# Patient Record
Sex: Female | Born: 1990 | Race: White | Hispanic: No | Marital: Married | Smoking: Never smoker
Health system: Southern US, Community
[De-identification: ages and names within clinical notes are randomized; demographics above are authoritative.]

## PROBLEM LIST (undated history)

## (undated) DIAGNOSIS — R112 Nausea with vomiting, unspecified: Secondary | ICD-10-CM

## (undated) DIAGNOSIS — E232 Diabetes insipidus: Secondary | ICD-10-CM

## (undated) DIAGNOSIS — Z9889 Other specified postprocedural states: Secondary | ICD-10-CM

## (undated) DIAGNOSIS — E220 Acromegaly and pituitary gigantism: Secondary | ICD-10-CM

## (undated) DIAGNOSIS — E559 Vitamin D deficiency, unspecified: Secondary | ICD-10-CM

## (undated) DIAGNOSIS — D352 Benign neoplasm of pituitary gland: Secondary | ICD-10-CM

## (undated) DIAGNOSIS — E038 Other specified hypothyroidism: Secondary | ICD-10-CM

## (undated) DIAGNOSIS — N912 Amenorrhea, unspecified: Secondary | ICD-10-CM

## (undated) HISTORY — DX: Nausea with vomiting, unspecified: R11.2

## (undated) HISTORY — DX: Acromegaly and pituitary gigantism: E22.0

## (undated) HISTORY — DX: Other specified postprocedural states: Z98.890

## (undated) HISTORY — PX: OVARIAN CYST REMOVAL: SHX89

## (undated) HISTORY — DX: Amenorrhea, unspecified: N91.2

## (undated) HISTORY — DX: Benign neoplasm of pituitary gland: D35.2

## (undated) HISTORY — PX: TUBAL LIGATION: SHX77

## (undated) HISTORY — DX: Vitamin D deficiency, unspecified: E55.9

## (undated) HISTORY — PX: TRANSPHENOIDAL PITUITARY RESECTION: SHX2572

---
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
## 2009-07-25 ENCOUNTER — Emergency Department: Payer: Self-pay | Admitting: Emergency Medicine

## 2010-09-27 ENCOUNTER — Ambulatory Visit: Payer: Self-pay | Admitting: Obstetrics and Gynecology

## 2010-10-21 ENCOUNTER — Ambulatory Visit: Payer: Self-pay | Admitting: Obstetrics and Gynecology

## 2010-11-09 ENCOUNTER — Ambulatory Visit: Payer: Self-pay | Admitting: Obstetrics and Gynecology

## 2014-09-03 ENCOUNTER — Ambulatory Visit: Payer: Self-pay | Admitting: Radiation Oncology

## 2014-09-03 ENCOUNTER — Ambulatory Visit: Payer: Self-pay

## 2014-09-10 ENCOUNTER — Ambulatory Visit
Admission: RE | Admit: 2014-09-10 | Discharge: 2014-09-10 | Disposition: A | Discharge status: Home / Self Care | Payer: Medicaid Other | Source: Ambulatory Visit | Attending: Radiation Oncology | Admitting: Radiation Oncology

## 2014-09-10 ENCOUNTER — Ambulatory Visit: Payer: Medicaid Other

## 2014-09-16 ENCOUNTER — Encounter: Payer: Self-pay | Admitting: Radiation Oncology

## 2014-09-16 NOTE — Progress Notes (Signed)
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 Later while in [place] she reported post partum ice pick headaches which resulted in her initial diagnosis of the tumor. Then, tinnitus develop. Ultimately, she was diagnosed with acromegaly in 2012 in [place].  Past or anticipated interventions, if any, per neurosurgery: yes, pituitary resections x 2 (03/2011, 06/2011)  Past or anticipated interventions, if any, per medical oncology: no  Dose of Decadron, if applicable: no  Recent neurologic symptoms, if any:   Seizures: no  Headaches: yes  Nausea: no  Dizziness/ataxia: no  Difficulty with hand coordination: no  Focal numbness/weakness: no  Visual deficits/changes: no  Confusion/Memory deficits: no  Painful bone metastases at present, if any: no  SAFETY ISSUES:  Prior radiation? no  Pacemaker/ICD? no  Possible current pregnancy? no  Is the patient on methotrexate? no  Additional Complaints / other details: 24 year old. Reports weight gain, tinnitus, fatigue, infrequent menstruation, and depression. [facility] notes indicate the tumor is not clearly visualized on MRI thus, surgery is not an option and radiation would have to be non focal.

## 2014-09-17 ENCOUNTER — Encounter: Payer: Self-pay | Admitting: Radiation Oncology

## 2014-09-17 ENCOUNTER — Ambulatory Visit: Payer: Medicaid Other

## 2014-09-17 ENCOUNTER — Telehealth: Payer: Self-pay | Admitting: Radiation Oncology

## 2014-09-17 ENCOUNTER — Ambulatory Visit
Admission: RE | Admit: 2014-09-17 | Discharge: 2014-09-17 | Disposition: A | Discharge status: Home / Self Care | Payer: Medicaid Other | Source: Ambulatory Visit | Attending: Radiation Oncology | Admitting: Radiation Oncology

## 2014-09-17 DIAGNOSIS — D352 Benign neoplasm of pituitary gland: Secondary | ICD-10-CM | POA: Insufficient documentation

## 2014-09-17 HISTORY — DX: Diabetes insipidus: E23.2

## 2014-09-17 HISTORY — DX: Other specified hypothyroidism: E03.8

## 2014-09-17 NOTE — Telephone Encounter (Signed)
Patient did not show for consultation appointment with Dr. Tammi Klippel. Phoned her home out of concern. Patient reports she forgot the appointment. She request to reschedule. Santiago Glad Dillingham to reach out to patient to reschedule. Also, requested Leonard Downing obtain MRI disc from [facility] (provided her with dates needed). Informed Dr. Tammi Klippel of these findings.

## 2014-09-22 ENCOUNTER — Telehealth: Payer: Self-pay | Admitting: *Deleted

## 2014-09-22 NOTE — Telephone Encounter (Signed)
CD received from [facility] with scans dating back to 2011  Gave to Dr. Tammi Klippel

## 2014-09-25 ENCOUNTER — Inpatient Hospital Stay
Admission: RE | Admit: 2014-09-25 | Discharge: 2014-09-25 | Disposition: A | Discharge status: Home / Self Care | Payer: Self-pay | Source: Ambulatory Visit | Attending: Radiation Oncology | Admitting: Radiation Oncology

## 2014-09-25 ENCOUNTER — Other Ambulatory Visit: Payer: Self-pay | Admitting: Radiation Oncology

## 2014-09-25 DIAGNOSIS — D352 Benign neoplasm of pituitary gland: Secondary | ICD-10-CM

## 2014-09-30 ENCOUNTER — Encounter: Payer: Self-pay | Admitting: Emergency Medicine

## 2014-09-30 ENCOUNTER — Emergency Department: Payer: Medicaid Other

## 2014-09-30 ENCOUNTER — Emergency Department
Admission: EM | Admit: 2014-09-30 | Discharge: 2014-09-30 | Disposition: A | Discharge status: Home / Self Care | Payer: Medicaid Other | Attending: Emergency Medicine | Admitting: Emergency Medicine

## 2014-09-30 DIAGNOSIS — R112 Nausea with vomiting, unspecified: Secondary | ICD-10-CM | POA: Diagnosis present

## 2014-09-30 DIAGNOSIS — Z79899 Other long term (current) drug therapy: Secondary | ICD-10-CM | POA: Diagnosis not present

## 2014-09-30 DIAGNOSIS — Z793 Long term (current) use of hormonal contraceptives: Secondary | ICD-10-CM | POA: Diagnosis not present

## 2014-09-30 DIAGNOSIS — Z3202 Encounter for pregnancy test, result negative: Secondary | ICD-10-CM | POA: Insufficient documentation

## 2014-09-30 DIAGNOSIS — R1013 Epigastric pain: Secondary | ICD-10-CM | POA: Diagnosis not present

## 2014-09-30 DIAGNOSIS — R42 Dizziness and giddiness: Secondary | ICD-10-CM | POA: Diagnosis not present

## 2014-09-30 LAB — COMPREHENSIVE METABOLIC PANEL
ALT: 18 U/L (ref 14–54)
ANION GAP: 13 (ref 5–15)
AST: 34 U/L (ref 15–41)
Albumin: 4.2 g/dL (ref 3.5–5.0)
Alkaline Phosphatase: 138 U/L — ABNORMAL HIGH (ref 38–126)
BUN: 11 mg/dL (ref 6–20)
CHLORIDE: 106 mmol/L (ref 101–111)
CO2: 22 mmol/L (ref 22–32)
CREATININE: 0.7 mg/dL (ref 0.44–1.00)
Calcium: 9.6 mg/dL (ref 8.9–10.3)
Glucose, Bld: 132 mg/dL — ABNORMAL HIGH (ref 65–99)
Potassium: 3.2 mmol/L — ABNORMAL LOW (ref 3.5–5.1)
Sodium: 141 mmol/L (ref 135–145)
Total Bilirubin: <0.1 mg/dL — ABNORMAL LOW (ref 0.3–1.2)
Total Protein: 7.5 g/dL (ref 6.5–8.1)

## 2014-09-30 LAB — URINALYSIS COMPLETE WITH MICROSCOPIC (ARMC ONLY)
BILIRUBIN URINE: NEGATIVE
Bacteria, UA: NONE SEEN
Glucose, UA: NEGATIVE mg/dL
Hgb urine dipstick: NEGATIVE
KETONES UR: NEGATIVE mg/dL
Nitrite: NEGATIVE
Protein, ur: NEGATIVE mg/dL
Specific Gravity, Urine: 1.01 (ref 1.005–1.030)
pH: 6 (ref 5.0–8.0)

## 2014-09-30 LAB — POCT PREGNANCY, URINE: Preg Test, Ur: NEGATIVE

## 2014-09-30 LAB — LIPASE, BLOOD: LIPASE: 24 U/L (ref 22–51)

## 2014-09-30 LAB — CBC
HCT: 38.4 % (ref 35.0–47.0)
Hemoglobin: 12.8 g/dL (ref 12.0–16.0)
MCH: 30.9 pg (ref 26.0–34.0)
MCHC: 33.4 g/dL (ref 32.0–36.0)
MCV: 92.4 fL (ref 80.0–100.0)
PLATELETS: 240 10*3/uL (ref 150–440)
RBC: 4.16 MIL/uL (ref 3.80–5.20)
RDW: 13 % (ref 11.5–14.5)
WBC: 15.9 10*3/uL — ABNORMAL HIGH (ref 3.6–11.0)

## 2014-09-30 LAB — TROPONIN I: Troponin I: <0.03 ng/mL (ref <0.031)

## 2014-09-30 MED ORDER — SODIUM CHLORIDE 0.9 % IV BOLUS (SEPSIS)
1000.0000 mL | Freq: Once | INTRAVENOUS | Status: AC
  Administered 2014-09-30: 1000 mL via INTRAVENOUS

## 2014-09-30 MED ORDER — OXYCODONE HCL 5 MG PO TABS
5.0000 mg | ORAL_TABLET | Freq: Four times a day (QID) | ORAL | Status: AC | PRN
Start: 2014-09-30 — End: ?

## 2014-09-30 MED ORDER — GI COCKTAIL ~~LOC~~
30.0000 mL | Freq: Once | ORAL | Status: AC
  Administered 2014-09-30: 30 mL via ORAL
  Filled 2014-09-30: qty 30

## 2014-09-30 MED ORDER — METOCLOPRAMIDE HCL 5 MG/ML IJ SOLN
INTRAMUSCULAR | Status: AC
  Administered 2014-09-30: 10 mg
  Filled 2014-09-30: qty 2

## 2014-09-30 MED ORDER — ONDANSETRON 4 MG PO TBDP: 4.0000 mg | ORAL_TABLET | Freq: Four times a day (QID) | ORAL | Status: DC | PRN

## 2014-09-30 MED ORDER — MORPHINE SULFATE (PF) 4 MG/ML IV SOLN
4.0000 mg | Freq: Once | INTRAVENOUS | Status: AC
  Administered 2014-09-30: 4 mg via INTRAVENOUS
  Filled 2014-09-30: qty 1

## 2014-09-30 MED ORDER — METOCLOPRAMIDE HCL 5 MG/ML IJ SOLN: 10.0000 mg | Freq: Once | INTRAMUSCULAR | Status: AC

## 2014-09-30 MED ORDER — ONDANSETRON HCL 4 MG/2ML IJ SOLN
INTRAMUSCULAR | Status: AC
  Administered 2014-09-30: 4 mg via INTRAVENOUS
  Filled 2014-09-30: qty 2

## 2014-09-30 MED ORDER — ONDANSETRON HCL 4 MG/2ML IJ SOLN
4.0000 mg | Freq: Once | INTRAMUSCULAR | Status: AC
  Administered 2014-09-30: 4 mg via INTRAVENOUS

## 2014-09-30 MED ORDER — IOHEXOL 300 MG/ML  SOLN
100.0000 mL | Freq: Once | INTRAMUSCULAR | Status: AC | PRN
  Administered 2014-09-30: 100 mL via INTRAVENOUS

## 2014-09-30 MED ORDER — ONDANSETRON HCL 4 MG/2ML IJ SOLN
INTRAMUSCULAR | Status: AC
  Filled 2014-09-30: qty 2

## 2014-09-30 MED ORDER — OXYCODONE HCL 5 MG PO TABS
5.0000 mg | ORAL_TABLET | ORAL | Status: AC
  Administered 2014-09-30: 5 mg via ORAL
  Filled 2014-09-30: qty 1

## 2014-09-30 MED ORDER — IOHEXOL 240 MG/ML SOLN
25.0000 mL | Freq: Once | INTRAMUSCULAR | Status: AC | PRN
  Administered 2014-09-30: 25 mL via ORAL

## 2014-09-30 MED ORDER — ONDANSETRON HCL 4 MG/2ML IJ SOLN
4.0000 mg | Freq: Once | INTRAMUSCULAR | Status: AC
Start: 2014-09-30 — End: 2014-09-30
  Administered 2014-09-30: 4 mg via INTRAVENOUS

## 2014-09-30 NOTE — ED Notes (Addendum)
Pt to triage via w/c actively vomiting that has been constant tonight with mid abd pain

## 2014-09-30 NOTE — Discharge Instructions (Signed)

## 2014-09-30 NOTE — ED Provider Notes (Addendum)
IMPRESSION: Mild bowel wall thickening of multiple distal small bowel loops with minimal haziness of adjacent mesenteric fat and associated minimal likely reactive wall thickening of a normal caliber appendix.  Findings are most consistent with a small bowel enteritis question infection versus inflammatory bowel disease.       Patient's CT results and history seem most consistent with gastroenteritis. No previous history of inflammatory bowel disease and she denies any blood in her stool. I will treat her symptomatically, Zofran, close primary care follow-up, and advised close return precautions. Patient and her husband are both very agreeable.  Delman Kitten, MD 09/30/14 1032  I will prescribe the patient a narcotic pain medicine due to their condition which I anticipate will cause at least moderate pain short term. I discussed with the patient safe use of narcotic pain medicines, and that they are not to drive, work in dangerous areas, or ever take more than prescribed (no more than 1 pill every 6 hours). We discussed that this is the type of medication that "Alfonse Spruce" may have overdosed on and the risks of this type of medicine. Patient is very agreeable to only use as prescribed and to never use more than prescribed.   Delman Kitten, MD 09/30/14 [phone]

## 2014-09-30 NOTE — ED Provider Notes (Signed)
[facility] Emergency Department Provider Note  ____________________________________________  Time seen: Approximately 903 AM  I have reviewed the triage vital signs and the nursing notes.   HISTORY  Chief Complaint Emesis    HPI Hannah Stanley is a 24 y.o. female who reports that she went to bed at midnight woke up at 2 AM with abdominal pain. The patient reports that she vomited and felt better. She reports though that she kept vomiting 10 times in 2 hours. She reports that she felt dizzy so she decided to come in to get checked. The patient reports that she vomited 3-4 times on the way here and then vomited in the waiting room. She reports that when she arrived she felt as though she'll pass out. She reports the pain earlier was achy and a 9 out of 10 but is now 8. She reports it is brownish appearing emesis. The patient denies any diarrhea chest pain or shortness of breath. 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 She also reports that her mother has viral hepatitis and she has been on octreotide for acromegaly. The patient denies a headache. The patient has not been able to keep anything down since the vomiting started. Nothing has made it better and nothing has made it worse.   Past Medical History  Diagnosis Date  . Acromegaly   . Pituitary macroadenoma   . Diabetes insipidus, neurohypophyseal   . PONV (postoperative nausea and vomiting)   . Amenorrhea   . Central hypothyroidism   . Vitamin D deficiency     Patient Active Problem List   Diagnosis Date Noted  . Pituitary adenoma 09/17/2014    Past Surgical History  Procedure Laterality Date  . Tubal ligation    . Ovarian cyst removal    . Cesarean section      low transverse  . Transphenoidal pituitary resection  03/2011, 06/2011    pituitary resections    Current Outpatient Rx  Name  Route  Sig  Dispense  Refill  . cabergoline  (DOSTINEX) 0.5 MG tablet   Oral   Take 0.5 mg by mouth 2 (two) times a week.          . Cholecalciferol (VITAMIN D3) 5000 UNITS CAPS   Oral   Take 1 capsule by mouth daily.          Marland Kitchen estradiol (ESTRACE) 2 MG tablet   Oral   Take 4 mg by mouth daily.          Marland Kitchen levothyroxine (SYNTHROID, LEVOTHROID) 88 MCG tablet   Oral   Take 88 mcg by mouth daily before breakfast.          . octreotide (SANDOSTATIN) 50 MCG/ML SOLN injection   Subcutaneous   Inject 50 mcg into the skin every 12 (twelve) hours.          . progesterone (PROMETRIUM) 100 MG capsule   Oral   Take 100 mg by mouth See admin instructions. Take 10 days out of the month         . Syringe, Disposable, 1 ML MISC   Subcutaneous   Inject 1 Syringe into the skin 2 (two) times daily.            Allergies Vancomycin  Family History  Problem Relation Age of Onset  . Diabetes Maternal Grandmother   . Thyroid disease Maternal Grandmother   . Clotting disorder Maternal Grandfather   .  Stroke Maternal Grandfather     Social History Social History  Substance Use Topics  . Smoking status: Never Smoker   . Smokeless tobacco: Never Used  . Alcohol Use: No    Review of Systems Constitutional: No fever/chills Eyes: No visual changes. ENT: No sore throat. Cardiovascular: Denies chest pain. Respiratory: Denies shortness of breath. Gastrointestinal: Abdominal pain, nausea, vomiting  No diarrhea.  No constipation. Genitourinary: Negative for dysuria. Musculoskeletal: Negative for back pain. Skin: Negative for rash. Neurological: Dizziness  10-point ROS otherwise negative.  ____________________________________________   PHYSICAL EXAM:  VITAL SIGNS: ED Triage Vitals  Enc Vitals Group     BP 09/30/14 0502 156/106 mmHg     Pulse Rate 09/30/14 0502 113     Resp 09/30/14 0718 18     Temp 09/30/14 0502 98.3 F (36.8 C)     Temp Source 09/30/14 0502 Oral     SpO2 09/30/14 0502 97 %     Weight  09/30/14 0502 198 lb (89.812 kg)     Height 09/30/14 0502 5\' 6"  (1.676 m)     Head Cir --      Peak Flow --      Pain Score 09/30/14 0502 9     Pain Loc --      Pain Edu? --      Excl. in [place]? --     Constitutional: Alert and oriented. Well appearing and in no acute distress. Eyes: Conjunctivae are normal. PERRL. EOMI. Head: Atraumatic. Nose: No congestion/rhinnorhea. Mouth/Throat: Mucous membranes are moist.  Oropharynx non-erythematous. Cardiovascular: Normal rate, regular rhythm. Grossly normal heart sounds.  Good peripheral circulation. Respiratory: Normal respiratory effort.  No retractions. Lungs CTAB. Gastrointestinal: Soft with epigastric tenderness to palpation. No distention. Positive bowel sounds Musculoskeletal: No lower extremity tenderness nor edema.   Neurologic:  Normal speech and language.  Skin:  Skin is warm, dry and intact. Psychiatric: Mood and affect are normal.  ____________________________________________   LABS (all labs ordered are listed, but only abnormal results are displayed)  Labs Reviewed  CBC - Abnormal; Notable for the following:    WBC 15.9 (*)    All other components within normal limits  COMPREHENSIVE METABOLIC PANEL - Abnormal; Notable for the following:    Potassium 3.2 (*)    Glucose, Bld 132 (*)    Alkaline Phosphatase 138 (*)    Total Bilirubin <0.1 (*)    All other components within normal limits  URINALYSIS COMPLETEWITH MICROSCOPIC ([facility] ONLY) - Abnormal; Notable for the following:    Color, Urine YELLOW (*)    APPearance CLEAR (*)    Leukocytes, UA 1+ (*)    Squamous Epithelial / LPF 0-5 (*)    All other components within normal limits  LIPASE, BLOOD  POCT PREGNANCY, URINE   ____________________________________________  EKG  ED ECG REPORT I, Loney Hering, the attending physician, personally viewed and interpreted this ECG.   Date: 09/30/2014  EKG Time: 631  Rate: 89  Rhythm: normal sinus rhythm  Axis:  normal  Intervals:none  ST&T Change: none  ____________________________________________  RADIOLOGY  [place] abd: normal right upper quadrant abdominal sonogram with no cholelithiasis ____________________________________________   PROCEDURES  Procedure(s) performed: None  Critical Care performed: No  ____________________________________________   INITIAL IMPRESSION / ASSESSMENT AND PLAN / ED COURSE  Pertinent labs & imaging results that were available during my care of the patient were reviewed by me and considered in my medical decision making (see chart for details).  This is  a 24 year old female who comes in with abdominal pain and vomiting. The patient had a liter of normal saline and some Zofran. She then was having some pains like to give her perform a grams of morphine. The patient's ultrasound is negative and did give her a second liter of normal saline. I will do a CT scan to evaluate the patient's abdomen and reassess the patient when she's had her imaging results.  The patient's care will be signed out to Dr Jacqualine Code who will follow up the results of the CT scan. ____________________________________________    FINAL CLINICAL IMPRESSION(S) / ED DIAGNOSES  Final diagnoses:  Epigastric pain      Loney Hering, MD 09/30/14 [phone]

## 2014-10-02 ENCOUNTER — Ambulatory Visit: Payer: Medicaid Other | Admitting: Radiation Oncology

## 2014-10-02 ENCOUNTER — Ambulatory Visit: Payer: Medicaid Other

## 2014-10-21 ENCOUNTER — Ambulatory Visit
Admission: RE | Admit: 2014-10-21 | Discharge: 2014-10-21 | Disposition: A | Discharge status: Home / Self Care | Payer: Medicaid Other | Source: Ambulatory Visit | Attending: Preventative Medicine | Admitting: Preventative Medicine

## 2014-10-21 ENCOUNTER — Encounter: Payer: Self-pay | Admitting: *Deleted

## 2014-10-21 ENCOUNTER — Emergency Department
Admission: EM | Admit: 2014-10-21 | Discharge: 2014-10-22 | Disposition: A | Discharge status: Home / Self Care | Payer: Medicaid Other | Attending: Emergency Medicine | Admitting: Emergency Medicine

## 2014-10-21 ENCOUNTER — Other Ambulatory Visit: Payer: Self-pay | Admitting: Preventative Medicine

## 2014-10-21 DIAGNOSIS — J129 Viral pneumonia, unspecified: Secondary | ICD-10-CM

## 2014-10-21 DIAGNOSIS — J189 Pneumonia, unspecified organism: Secondary | ICD-10-CM | POA: Insufficient documentation

## 2014-10-21 DIAGNOSIS — E232 Diabetes insipidus: Secondary | ICD-10-CM | POA: Insufficient documentation

## 2014-10-21 DIAGNOSIS — R05 Cough: Secondary | ICD-10-CM | POA: Diagnosis present

## 2014-10-21 DIAGNOSIS — J159 Unspecified bacterial pneumonia: Secondary | ICD-10-CM | POA: Insufficient documentation

## 2014-10-21 DIAGNOSIS — Z79899 Other long term (current) drug therapy: Secondary | ICD-10-CM | POA: Diagnosis not present

## 2014-10-21 MED ORDER — BENZONATATE 100 MG PO CAPS
200.0000 mg | ORAL_CAPSULE | Freq: Once | ORAL | Status: AC
  Administered 2014-10-22: 200 mg via ORAL
  Filled 2014-10-21: qty 2

## 2014-10-21 NOTE — ED Notes (Signed)
Pt reports diagnosed with right lobe pneumonia today, took one dose of antibiotics. States doctor stated if not improving to come to ED. Pt states she feels worse.

## 2014-10-21 NOTE — ED Provider Notes (Signed)
[facility] Emergency Department Provider Note  ____________________________________________  Time seen: 11:00 PM  I have reviewed the triage vital signs and the nursing notes.   HISTORY  Chief Complaint Cough      HPI Hannah Stanley is a 24 y.o. female presents with one-week history of cough fever and congestion. Patient states that she was seen by her primary care [facility] and diagnosed with pneumonia today. Patient states that she took her first dose of antibiotics today and is not feeling better     Past Medical History  Diagnosis Date  . Acromegaly ([place])   . Pituitary macroadenoma ([place])   . Diabetes insipidus, neurohypophyseal ([place])   . PONV (postoperative nausea and vomiting)   . Amenorrhea   . Central hypothyroidism   . Vitamin D deficiency     Patient Active Problem List   Diagnosis Date Noted  . Pituitary adenoma ([place]) 09/17/2014    Past Surgical History  Procedure Laterality Date  . Tubal ligation    . Ovarian cyst removal    . Cesarean section      low transverse  . Transphenoidal pituitary resection  03/2011, 06/2011    pituitary resections    Current Outpatient Rx  Name  Route  Sig  Dispense  Refill  . cabergoline (DOSTINEX) 0.5 MG tablet   Oral   Take 0.5 mg by mouth 2 (two) times a week.          . Cholecalciferol (VITAMIN D3) 5000 UNITS CAPS   Oral   Take 1 capsule by mouth daily.          Marland Kitchen estradiol (ESTRACE) 2 MG tablet   Oral   Take 4 mg by mouth daily.          Marland Kitchen levothyroxine (SYNTHROID, LEVOTHROID) 88 MCG tablet   Oral   Take 88 mcg by mouth daily before breakfast.          . octreotide (SANDOSTATIN) 50 MCG/ML SOLN injection   Subcutaneous   Inject 50 mcg into the skin every 12 (twelve) hours.          . ondansetron (ZOFRAN ODT) 4 MG disintegrating tablet   Oral   Take 1 tablet (4 mg total) by mouth every 6 (six) hours as needed for nausea or vomiting.   20 tablet   0   .  oxyCODONE (OXY IR/ROXICODONE) 5 MG immediate release tablet   Oral   Take 1 tablet (5 mg total) by mouth every 6 (six) hours as needed for severe pain.   8 tablet   0   . progesterone (PROMETRIUM) 100 MG capsule   Oral   Take 100 mg by mouth See admin instructions. Take 10 days out of the month         . Syringe, Disposable, 1 ML MISC   Subcutaneous   Inject 1 Syringe into the skin 2 (two) times daily.            Allergies Vancomycin  Family History  Problem Relation Age of Onset  . Diabetes Maternal Grandmother   . Thyroid disease Maternal Grandmother   . Clotting disorder Maternal Grandfather   . Stroke Maternal Grandfather     Social History Social History  Substance Use Topics  . Smoking status: Never Smoker   . Smokeless tobacco: Never Used  . Alcohol Use: No    Review of Systems  Constitutional: Positive for fever and chills Eyes: Negative for visual changes. ENT: Negative  for sore throat. Cardiovascular: Negative for chest pain. Respiratory: Positive for cough and dyspnea Gastrointestinal: Negative for abdominal pain, vomiting and diarrhea. Genitourinary: Negative for dysuria. Musculoskeletal: Negative for back pain. Skin: Negative for rash. Neurological: Negative for headaches, focal weakness or numbness.   10-point ROS otherwise negative.  ____________________________________________   PHYSICAL EXAM:  VITAL SIGNS: ED Triage Vitals  Enc Vitals Group     BP 10/21/14 2247 110/70 mmHg     Pulse Rate 10/21/14 2247 98     Resp 10/21/14 2247 18     Temp 10/21/14 2247 98.1 F (36.7 C)     Temp Source 10/21/14 2247 Oral     SpO2 10/21/14 2247 98 %     Weight 10/21/14 2247 198 lb (89.812 kg)     Height 10/21/14 2247 5\' 6"  (1.676 m)     Head Cir --      Peak Flow --      Pain Score 10/21/14 2248 3     Pain Loc --      Pain Edu? --      Excl. in [place]? --     Constitutional: Alert and oriented. Well appearing and in no distress. Eyes:  Conjunctivae are normal. PERRL. Normal extraocular movements. ENT   Head: Normocephalic and atraumatic.   Nose: No congestion/rhinnorhea.   Mouth/Throat: Mucous membranes are moist.   Neck: No stridor. Cardiovascular: Normal rate, regular rhythm. Normal and symmetric distal pulses are present in all extremities. No murmurs, rubs, or gallops. Respiratory: Normal respiratory effort without tachypnea nor retractions. Breath sounds are clear and equal bilaterally. Right upper lobe rhonchi Gastrointestinal: Soft and nontender. No distention. There is no CVA tenderness. Genitourinary: deferred Musculoskeletal: Nontender with normal range of motion in all extremities. No joint effusions.  No lower extremity tenderness nor edema. Neurologic:  Normal speech and language. No gross focal neurologic deficits are appreciated. Speech is normal.  Skin:  Skin is warm, dry and intact. No rash noted. Psychiatric: Mood and affect are normal. Speech and behavior are normal. Patient exhibits appropriate insight and judgment.  ____________________________________________    LABS (pertinent positives/negatives)   Labs Reviewed  RAPID INFLUENZA A&B ANTIGENS ([place])        INITIAL IMPRESSION / ASSESSMENT AND PLAN / ED COURSE  Pertinent labs & imaging results that were available during my care of the patient were reviewed by me and considered in my medical decision making (see chart for details).  History of physical exam consistent with pneumonia which was confirmed on x-ray performed today. I counseled the patient at length in regards to managing symptoms until infection is cured.  ____________________________________________   FINAL CLINICAL IMPRESSION(S) / ED DIAGNOSES  Final diagnoses:  Community acquired pneumonia      Gregor Hams, MD 10/22/14 [phone]

## 2014-10-22 LAB — RAPID INFLUENZA A&B ANTIGENS
Influenza A (ARMC): NOT DETECTED
Influenza B (ARMC): NOT DETECTED

## 2014-10-22 MED ORDER — BENZONATATE 200 MG PO CAPS: 200.0000 mg | ORAL_CAPSULE | Freq: Three times a day (TID) | ORAL | Status: AC | PRN

## 2014-10-22 NOTE — ED Notes (Signed)

## 2014-10-22 NOTE — ED Notes (Signed)
Pt placed on droplet precaution, sign placed on door for visitors and staff.

## 2014-10-22 NOTE — Discharge Instructions (Signed)

## 2015-03-16 ENCOUNTER — Emergency Department
Admission: EM | Admit: 2015-03-16 | Discharge: 2015-03-16 | Disposition: A | Discharge status: Home / Self Care | Payer: Medicaid Other | Attending: Emergency Medicine | Admitting: Emergency Medicine

## 2015-03-16 ENCOUNTER — Encounter: Payer: Self-pay | Admitting: Emergency Medicine

## 2015-03-16 DIAGNOSIS — K2970 Gastritis, unspecified, without bleeding: Secondary | ICD-10-CM | POA: Insufficient documentation

## 2015-03-16 DIAGNOSIS — Z79899 Other long term (current) drug therapy: Secondary | ICD-10-CM | POA: Diagnosis not present

## 2015-03-16 DIAGNOSIS — R1010 Upper abdominal pain, unspecified: Secondary | ICD-10-CM | POA: Diagnosis present

## 2015-03-16 LAB — COMPREHENSIVE METABOLIC PANEL
ALBUMIN: 4.1 g/dL (ref 3.5–5.0)
ALK PHOS: 147 U/L — AB (ref 38–126)
ALT: 14 U/L (ref 14–54)
ANION GAP: 9 (ref 5–15)
AST: 24 U/L (ref 15–41)
BUN: 8 mg/dL (ref 6–20)
CALCIUM: 9.6 mg/dL (ref 8.9–10.3)
CO2: 23 mmol/L (ref 22–32)
CREATININE: 0.66 mg/dL (ref 0.44–1.00)
Chloride: 106 mmol/L (ref 101–111)
GFR calc Af Amer: >60 mL/min (ref >60)
GFR calc non Af Amer: >60 mL/min (ref >60)
GLUCOSE: 110 mg/dL — AB (ref 65–99)
Potassium: 3.8 mmol/L (ref 3.5–5.1)
SODIUM: 138 mmol/L (ref 135–145)
Total Bilirubin: 0.3 mg/dL (ref 0.3–1.2)
Total Protein: 7.4 g/dL (ref 6.5–8.1)

## 2015-03-16 LAB — URINALYSIS COMPLETE WITH MICROSCOPIC (ARMC ONLY)
BILIRUBIN URINE: NEGATIVE
GLUCOSE, UA: NEGATIVE mg/dL
KETONES UR: NEGATIVE mg/dL
LEUKOCYTES UA: NEGATIVE
Nitrite: NEGATIVE
Protein, ur: NEGATIVE mg/dL
Specific Gravity, Urine: 1.009 (ref 1.005–1.030)
pH: 6 (ref 5.0–8.0)

## 2015-03-16 LAB — LIPASE, BLOOD: Lipase: 21 U/L (ref 11–51)

## 2015-03-16 LAB — CBC
HCT: 36.4 % (ref 35.0–47.0)
HEMOGLOBIN: 12.3 g/dL (ref 12.0–16.0)
MCH: 31.1 pg (ref 26.0–34.0)
MCHC: 33.8 g/dL (ref 32.0–36.0)
MCV: 91.8 fL (ref 80.0–100.0)
Platelets: 270 10*3/uL (ref 150–440)
RBC: 3.96 MIL/uL (ref 3.80–5.20)
RDW: 12.8 % (ref 11.5–14.5)
WBC: 13.8 10*3/uL — AB (ref 3.6–11.0)

## 2015-03-16 MED ORDER — GI COCKTAIL ~~LOC~~
30.0000 mL | Freq: Once | ORAL | Status: AC
Start: 2015-03-16 — End: 2015-03-16
  Administered 2015-03-16: 30 mL via ORAL
  Filled 2015-03-16: qty 30

## 2015-03-16 MED ORDER — ONDANSETRON HCL 4 MG/2ML IJ SOLN
4.0000 mg | Freq: Once | INTRAMUSCULAR | Status: AC
  Administered 2015-03-16: 4 mg via INTRAVENOUS
  Filled 2015-03-16: qty 2

## 2015-03-16 MED ORDER — ONDANSETRON HCL 4 MG PO TABS: 4.0000 mg | ORAL_TABLET | Freq: Every day | ORAL | Status: AC | PRN

## 2015-03-16 MED ORDER — SODIUM CHLORIDE 0.9 % IV SOLN
1000.0000 mL | Freq: Once | INTRAVENOUS | Status: AC
  Administered 2015-03-16: 1000 mL via INTRAVENOUS

## 2015-03-16 NOTE — ED Provider Notes (Signed)
[facility] Emergency Department Provider Note  ____________________________________________    I have reviewed the triage vital signs and the nursing notes.   HISTORY  Chief Complaint Abdominal Pain and Emesis    HPI Hannah Stanley is a 25 y.o. female who presents with complaints of upper abdominal pain, nausea and vomiting. She also reports 1 episode of diarrhea. She denies a history of similar pain. She reports burning sensation across her upper abdomen which started yesterday evening. She denies fevers or chills. No recent travel. She has never had abdominal surgery. Nothing seems to make her pain better.     Past Medical History  Diagnosis Date  . Acromegaly ([place])   . Pituitary macroadenoma ([place])   . Diabetes insipidus, neurohypophyseal ([place])   . PONV (postoperative nausea and vomiting)   . Amenorrhea   . Central hypothyroidism   . Vitamin D deficiency     Patient Active Problem List   Diagnosis Date Noted  . Pituitary adenoma ([place]) 09/17/2014    Past Surgical History  Procedure Laterality Date  . Tubal ligation    . Ovarian cyst removal    . Cesarean section      low transverse  . Transphenoidal pituitary resection  03/2011, 06/2011    pituitary resections    Current Outpatient Rx  Name  Route  Sig  Dispense  Refill  . cabergoline (DOSTINEX) 0.5 MG tablet   Oral   Take 0.5 mg by mouth 2 (two) times a week. On Monday and Friday         . levothyroxine (SYNTHROID, LEVOTHROID) 88 MCG tablet   Oral   Take 88 mcg by mouth daily.          Marland Kitchen octreotide (SANDOSTATIN LAR) 10 MG injection   Intramuscular   Inject 10 mg into the muscle every 28 (twenty-eight) days.         . benzonatate (TESSALON) 200 MG capsule   Oral   Take 1 capsule (200 mg total) by mouth 3 (three) times daily as needed for cough. Patient not taking: Reported on 03/16/2015   20 capsule   0   . ondansetron (ZOFRAN ODT) 4 MG disintegrating tablet   Oral  Take 1 tablet (4 mg total) by mouth every 6 (six) hours as needed for nausea or vomiting. Patient not taking: Reported on 03/16/2015   20 tablet   0   . oxyCODONE (OXY IR/ROXICODONE) 5 MG immediate release tablet   Oral   Take 1 tablet (5 mg total) by mouth every 6 (six) hours as needed for severe pain. Patient not taking: Reported on 03/16/2015   8 tablet   0     Allergies Vancomycin  Family History  Problem Relation Age of Onset  . Diabetes Maternal Grandmother   . Thyroid disease Maternal Grandmother   . Clotting disorder Maternal Grandfather   . Stroke Maternal Grandfather     Social History Social History  Substance Use Topics  . Smoking status: Never Smoker   . Smokeless tobacco: Never Used  . Alcohol Use: No    Review of Systems  Constitutional: Negative for fever. Eyes: Negative for blurry vision ENT: Negative for sore throat Cardiovascular: Negative for chest pain Respiratory: Negative for cough Gastrointestinal: As above Genitourinary: Negative for dysuria. Musculoskeletal: Negative for back pain. Skin: Negative for rash. Neurological: Negative for focal weakness     ____________________________________________   PHYSICAL EXAM:  VITAL SIGNS: ED Triage Vitals  Enc Vitals Group  BP 03/16/15 0813 94/69 mmHg     Pulse Rate 03/16/15 0811 94     Resp 03/16/15 0811 22     Temp --      Temp src --      SpO2 03/16/15 0811 100 %     Weight 03/16/15 0811 209 lb (94.802 kg)     Height 03/16/15 0811 5\' 6"  (1.676 m)     Head Cir --      Peak Flow --      Pain Score 03/16/15 0812 10     Pain Loc --      Pain Edu? --      Excl. in [place]? --      Constitutional: Alert and oriented. Well appearing and in no distress.  Eyes: Conjunctivae are normal. No erythema or injection ENT   Head: Normocephalic and atraumatic.   Mouth/Throat: Mucous membranes are moist. Cardiovascular: Normal rate, regular rhythm. Normal and symmetric distal pulses are  present in the upper extremities.  Respiratory: Normal respiratory effort without tachypnea nor retractions. Breath sounds are clear and equal bilaterally.  Gastrointestinal: Soft and non-tender in all quadrants. No distention. There is no CVA tenderness. Genitourinary: deferred Musculoskeletal: Nontender with normal range of motion in all extremities. No lower extremity tenderness nor edema. Neurologic:  Normal speech and language. No gross focal neurologic deficits are appreciated. Skin:  Skin is warm, dry and intact. No rash noted. Psychiatric: Mood and affect are normal. Patient exhibits appropriate insight and judgment.  ____________________________________________    LABS (pertinent positives/negatives)  Labs Reviewed  CBC  COMPREHENSIVE METABOLIC PANEL  LIPASE, BLOOD  URINALYSIS COMPLETEWITH MICROSCOPIC ([place])    ____________________________________________   EKG  ED ECG REPORT I, Lavonia Drafts, the attending physician, personally viewed and interpreted this ECG.  Date: 03/16/2015 EKG Time: 8:26 AM Rate: 69 Rhythm: normal sinus rhythm QRS Axis: normal Intervals: normal ST/T Wave abnormalities: normal Conduction Disturbances: none Narrative Interpretation: unremarkable   ____________________________________________    RADIOLOGY  None  ____________________________________________   PROCEDURES  Procedure(s) performed: none  Critical Care performed: none  ____________________________________________   INITIAL IMPRESSION / ASSESSMENT AND PLAN / ED COURSE  Pertinent labs & imaging results that were available during my care of the patient were reviewed by me and considered in my medical decision making (see chart for details).  Patient presents with upper abdominal discomfort and nausea and vomiting. Her abdominal exam is reassuring. We will check labs, given Zofran IV and fluids and reassess.  Patient reports she feels "a lot better". We'll  discharge her with Zofran by mouth and return precautions were discussed  ____________________________________________   FINAL CLINICAL IMPRESSION(S) / ED DIAGNOSES  Final diagnoses:  Gastritis          Lavonia Drafts, MD 03/16/15 1214

## 2015-03-16 NOTE — ED Notes (Signed)
Pt presents with abd pain and vomiting started last night.

## 2015-03-16 NOTE — Discharge Instructions (Signed)

## 2015-06-19 ENCOUNTER — Other Ambulatory Visit: Payer: Self-pay | Admitting: Family Medicine

## 2015-06-19 ENCOUNTER — Ambulatory Visit
Admission: RE | Admit: 2015-06-19 | Discharge: 2015-06-19 | Disposition: A | Discharge status: Home / Self Care | Payer: Medicaid Other | Source: Ambulatory Visit | Attending: Family Medicine | Admitting: Family Medicine

## 2015-06-19 DIAGNOSIS — M238X9 Other internal derangements of unspecified knee: Secondary | ICD-10-CM

## 2015-06-19 DIAGNOSIS — M238X1 Other internal derangements of right knee: Secondary | ICD-10-CM

## 2015-06-19 DIAGNOSIS — M2480 Other specific joint derangements of unspecified joint, not elsewhere classified: Secondary | ICD-10-CM | POA: Diagnosis present

## 2015-06-19 DIAGNOSIS — M238X2 Other internal derangements of left knee: Secondary | ICD-10-CM

## 2015-06-19 DIAGNOSIS — M170 Bilateral primary osteoarthritis of knee: Secondary | ICD-10-CM | POA: Diagnosis not present

## 2016-12-30 IMAGING — CR DG KNEE STANDING AP BILAT
1 series · 1 of 1 positions shown · non-contrast
Comparison: None.

CLINICAL DATA: Bilateral knee crepitus.  Acromegaly.

EXAM:
BILATERAL KNEES STANDING - 1 VIEW; RIGHT KNEE - 3 VIEW; LEFT KNEE -
3 VIEW

[dg knee bilateral standing ap]
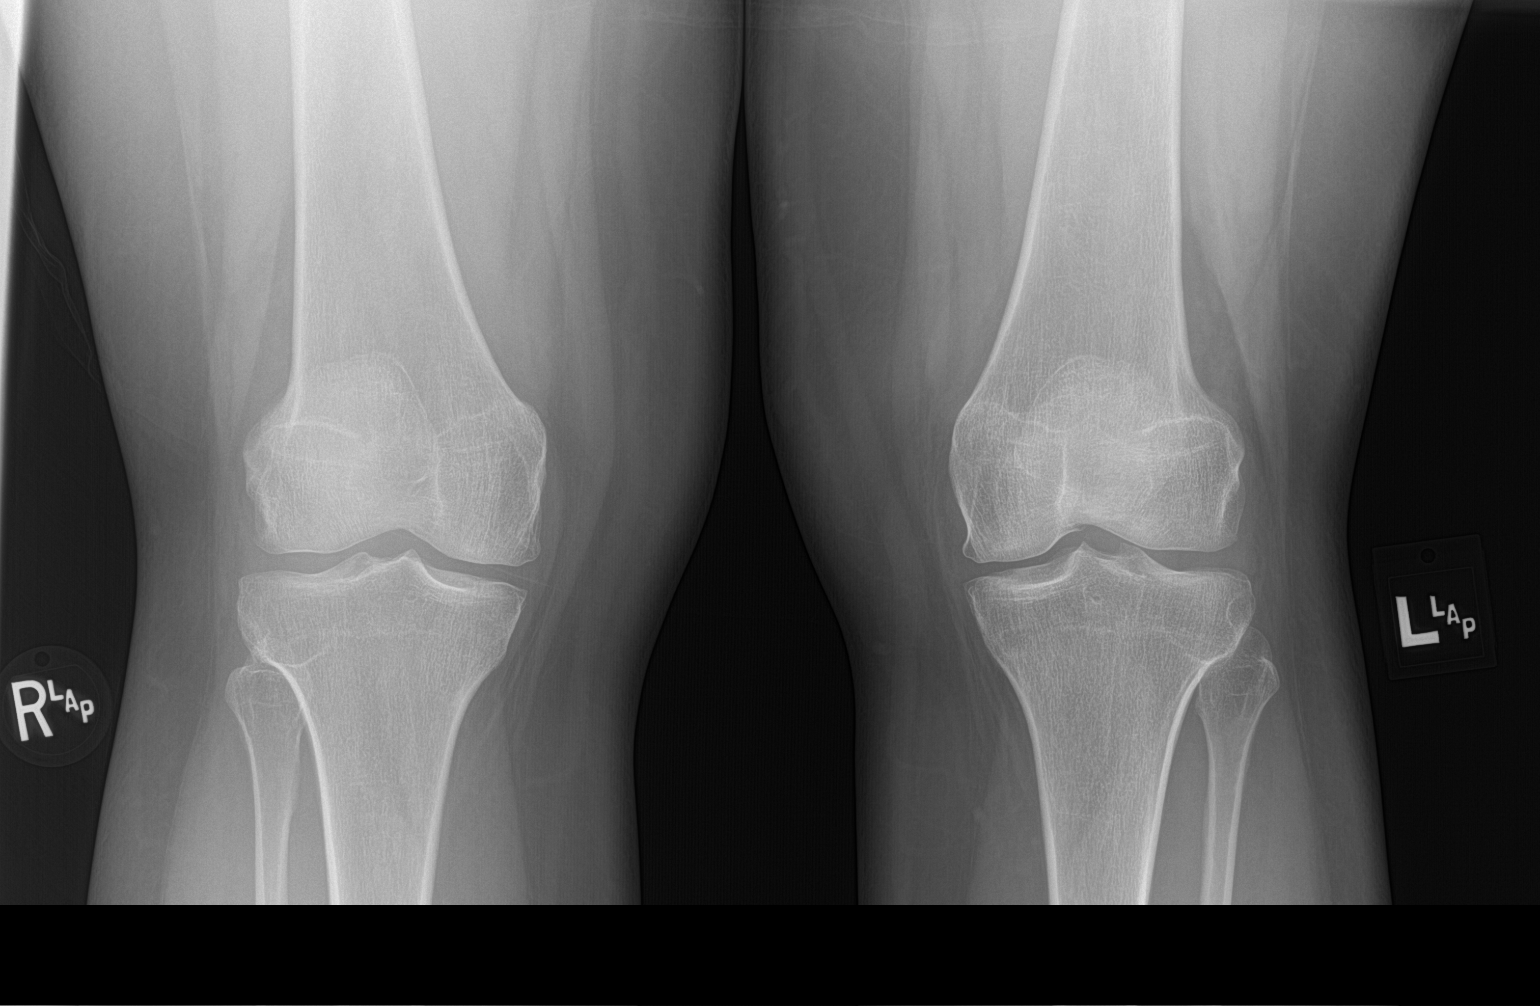

[1 of 1 positions shown; findings below may reference images not displayed]

FINDINGS: No fracture, joint effusion, suspicious focal osseous lesion or
malalignment in either knee.

Mild joint space narrowing and tiny lateral marginal patellar
osteophyte in the patellofemoral compartment of the right knee
joint. Preserved medial and lateral compartments of the right knee
joint.

Tiny marginal lateral patellar osteophyte without joint space
narrowing in the patellofemoral compartment of the left knee joint.
Preserved medial and lateral compartments of the left knee joint.
IMPRESSION: Mild right and minimal left patellofemoral compartment
osteoarthritis in the knees.

## 2016-12-30 IMAGING — CR DG KNEE 3 VIEWS*R*
1 series · 3 of 3 positions shown · non-contrast
Comparison: None.

CLINICAL DATA: Bilateral knee crepitus.  Acromegaly.

EXAM:
BILATERAL KNEES STANDING - 1 VIEW; RIGHT KNEE - 3 VIEW; LEFT KNEE -
3 VIEW

[Series 1: dg knee 3 views right · 0.14mm/px · 3 of 3 slices shown]
[im 1/3]
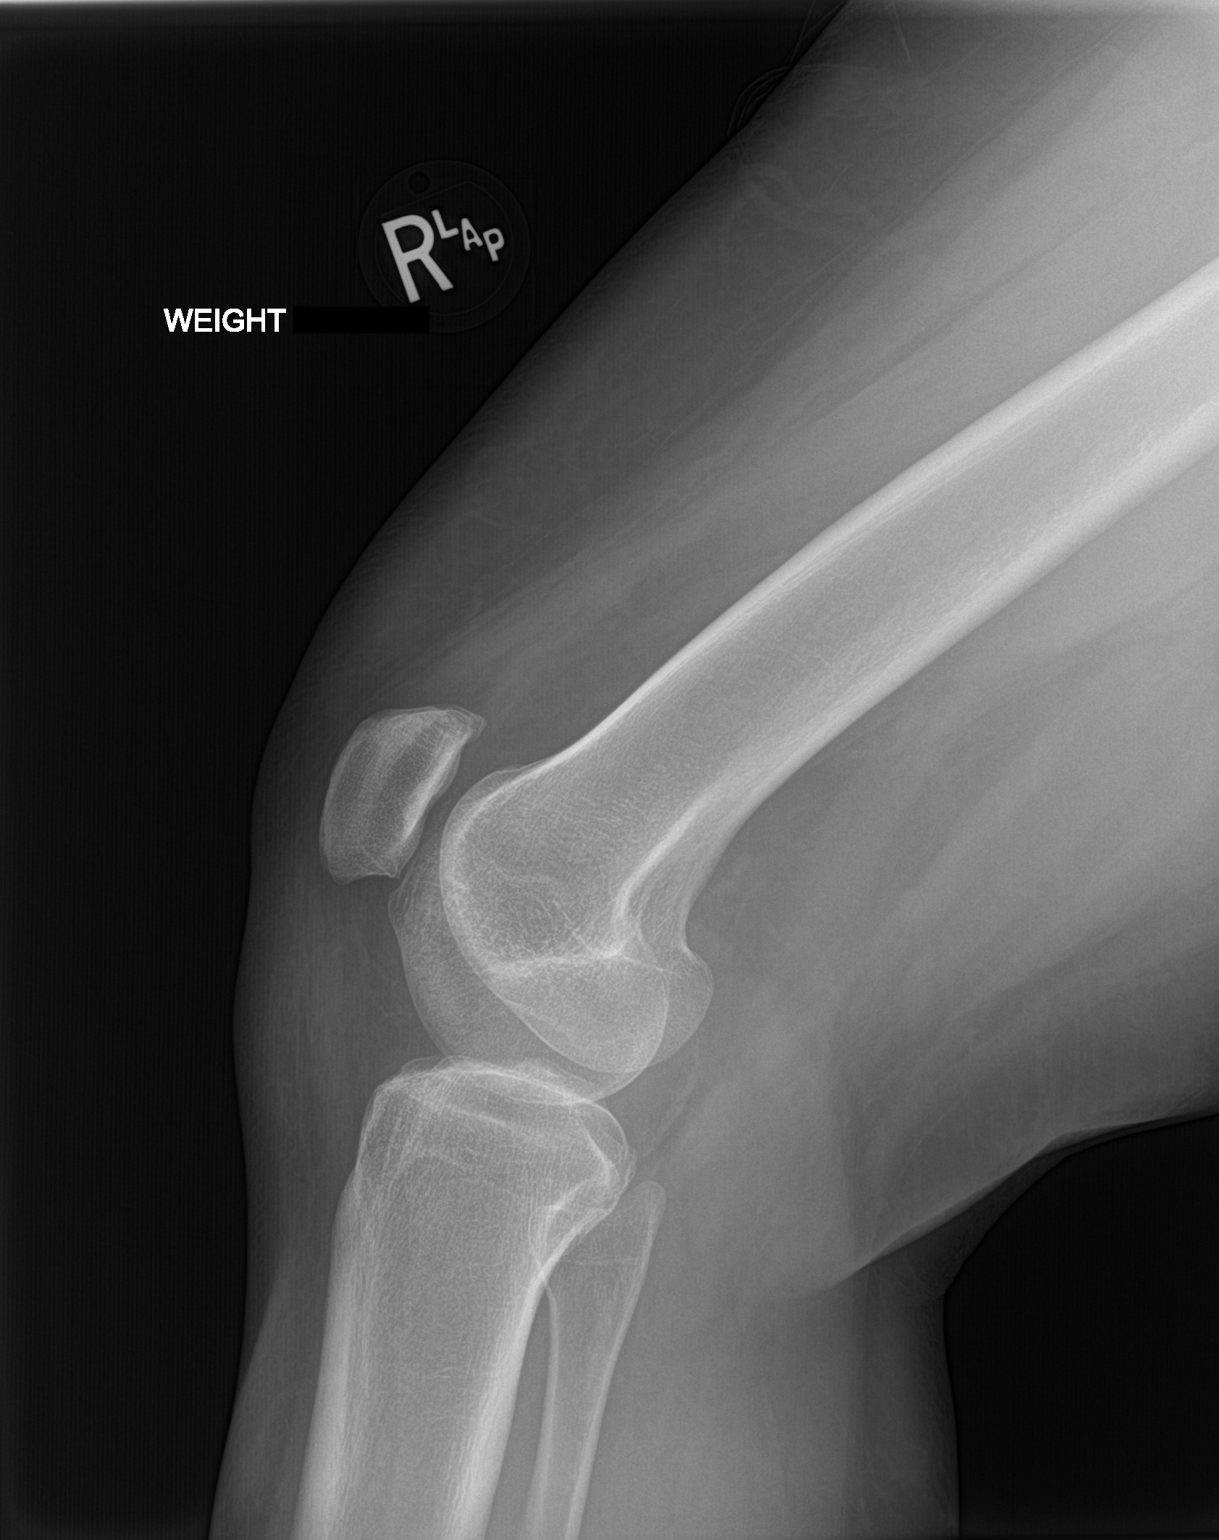
[im 2/3]
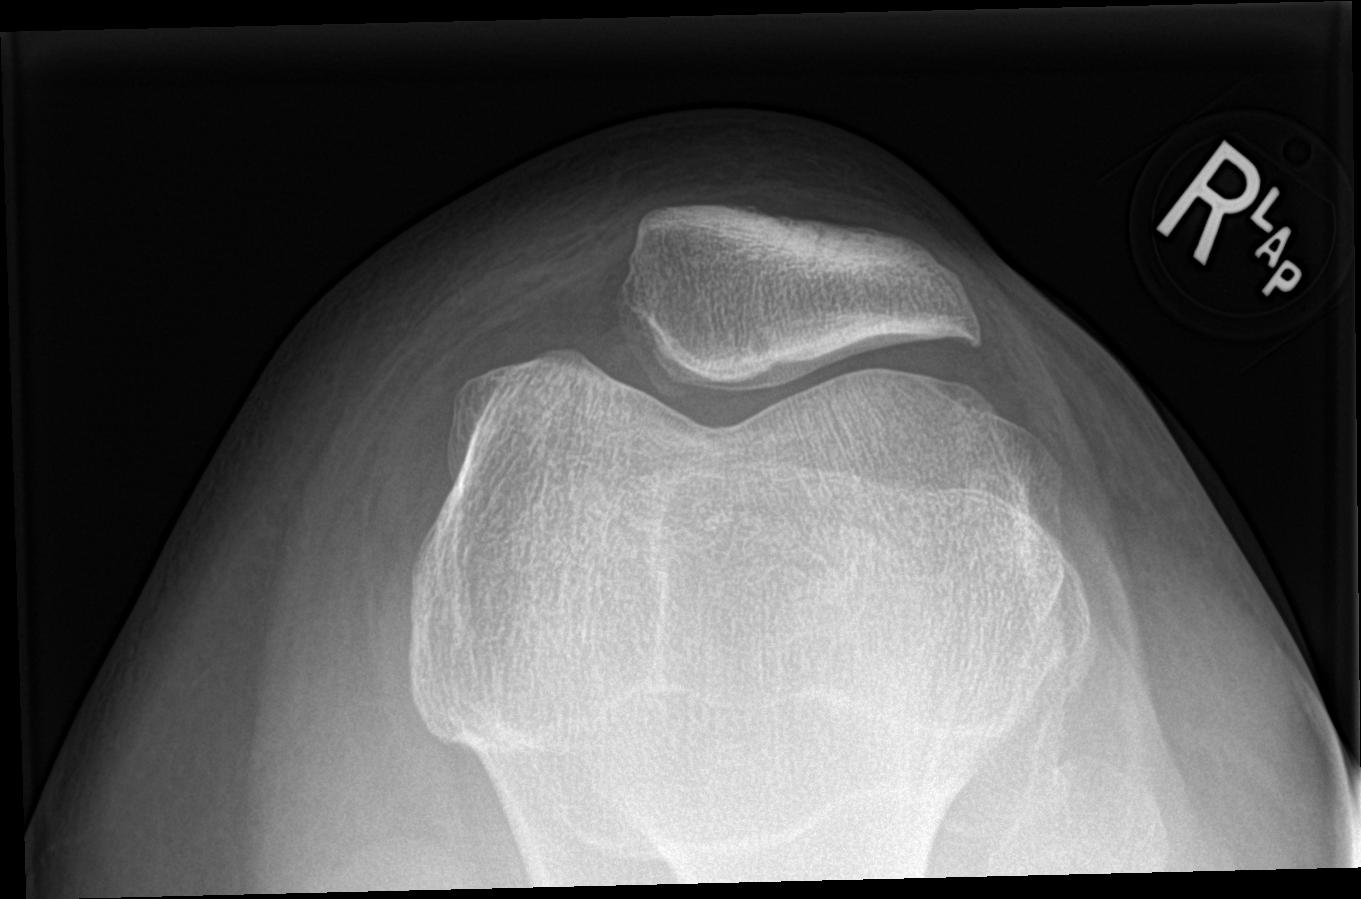
[im 3/3]
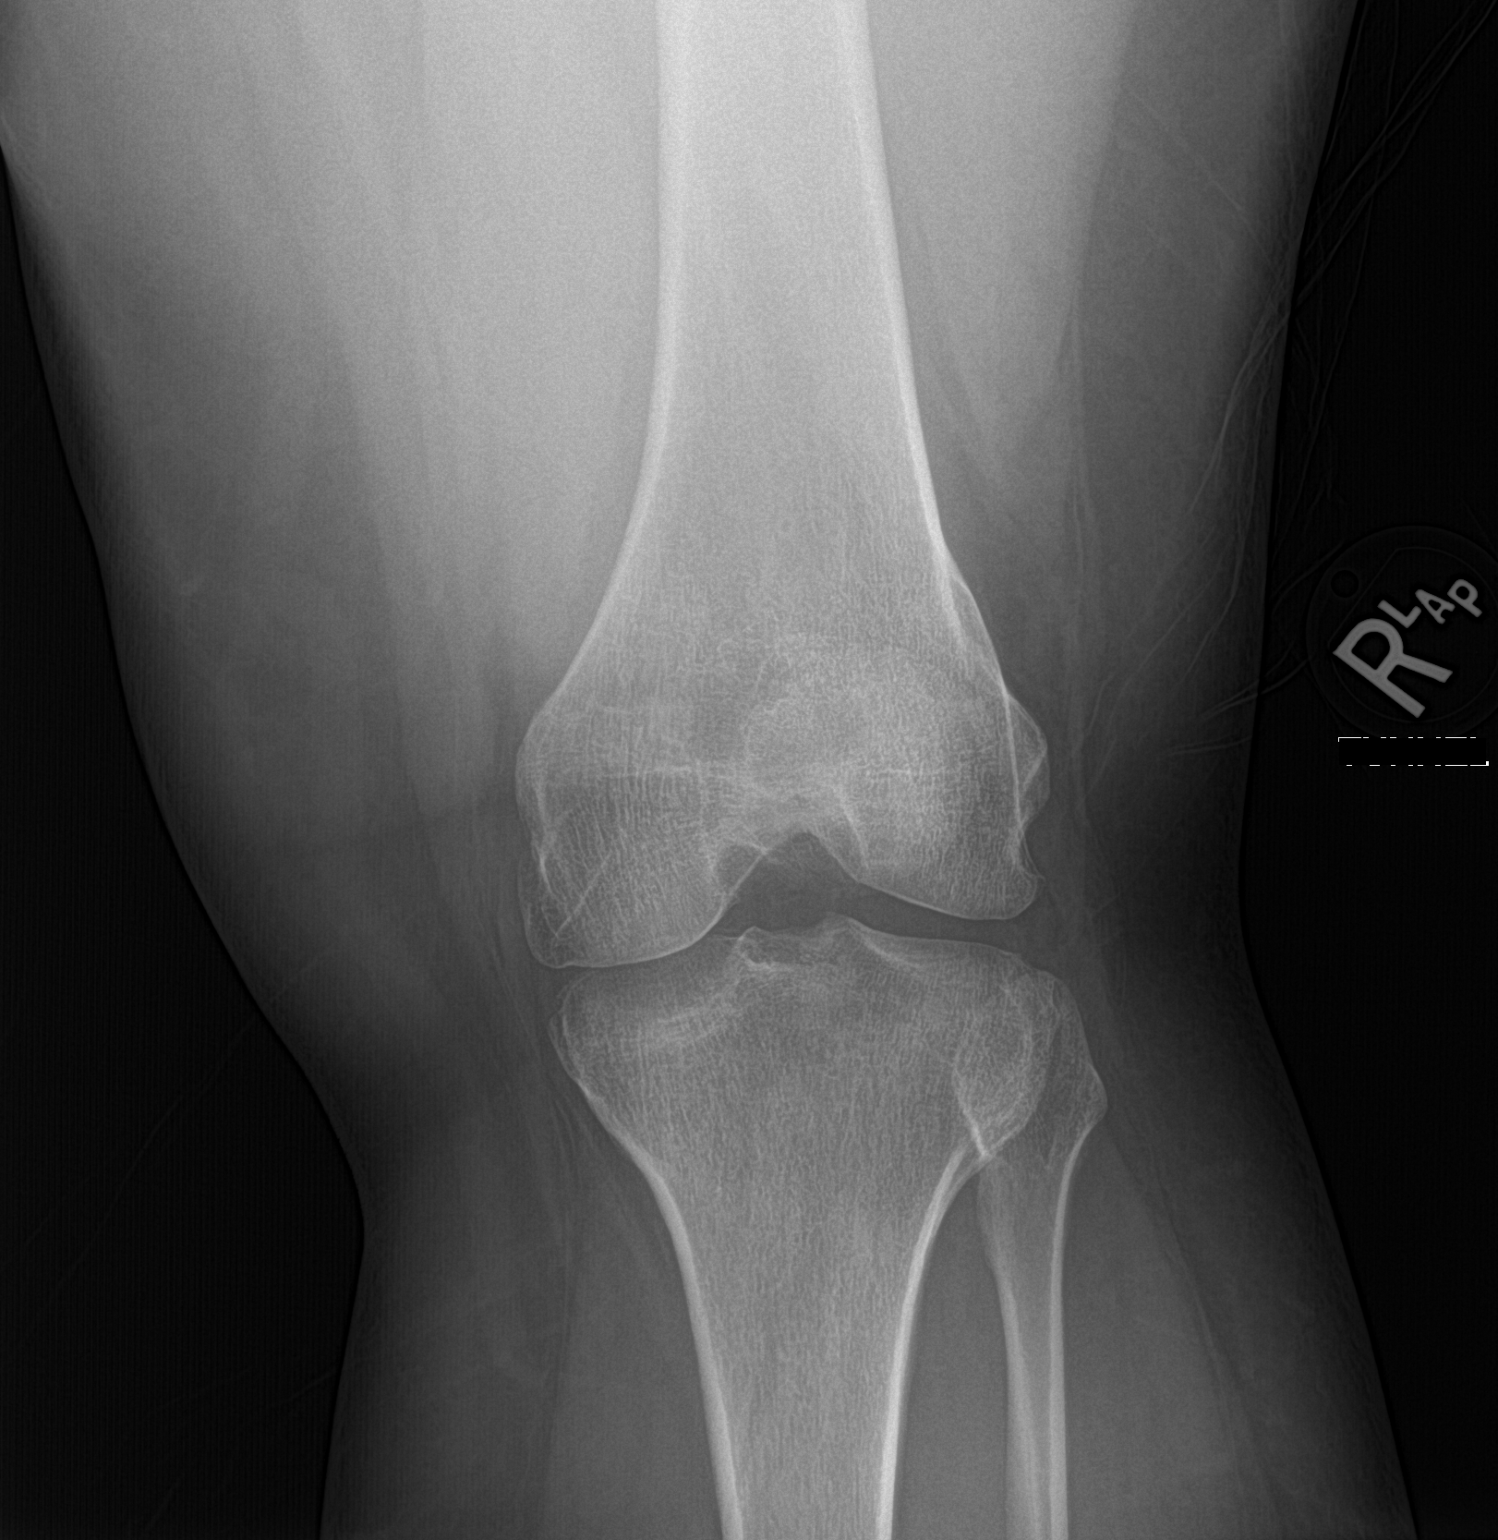

[3 of 3 positions shown; findings below may reference images not displayed]

FINDINGS: No fracture, joint effusion, suspicious focal osseous lesion or
malalignment in either knee.

Mild joint space narrowing and tiny lateral marginal patellar
osteophyte in the patellofemoral compartment of the right knee
joint. Preserved medial and lateral compartments of the right knee
joint.

Tiny marginal lateral patellar osteophyte without joint space
narrowing in the patellofemoral compartment of the left knee joint.
Preserved medial and lateral compartments of the left knee joint.
IMPRESSION: Mild right and minimal left patellofemoral compartment
osteoarthritis in the knees.
